# Patient Record
Sex: Female | Born: 1935 | Race: White | Hispanic: No | Marital: Married
Health system: Southern US, Community
[De-identification: ages and names within clinical notes are randomized; demographics above are authoritative.]

---

## 2004-08-26 ENCOUNTER — Ambulatory Visit: Payer: Self-pay | Admitting: Internal Medicine

## 2005-01-13 ENCOUNTER — Ambulatory Visit: Payer: Self-pay | Admitting: Internal Medicine

## 2006-02-16 ENCOUNTER — Ambulatory Visit: Payer: Self-pay | Admitting: Internal Medicine

## 2007-03-15 ENCOUNTER — Ambulatory Visit: Payer: Self-pay | Admitting: Internal Medicine

## 2008-03-25 ENCOUNTER — Ambulatory Visit: Payer: Self-pay | Admitting: Internal Medicine

## 2012-08-15 ENCOUNTER — Ambulatory Visit: Payer: Self-pay | Admitting: Ophthalmology

## 2012-10-03 ENCOUNTER — Ambulatory Visit: Payer: Self-pay | Admitting: Ophthalmology

## 2014-02-08 ENCOUNTER — Ambulatory Visit: Payer: Self-pay | Admitting: Internal Medicine

## 2014-03-27 ENCOUNTER — Ambulatory Visit: Payer: Self-pay | Admitting: Cardiology

## 2014-03-27 LAB — CBC WITH DIFFERENTIAL/PLATELET
BASOS PCT: 0.2 %
Basophil #: 0 10*3/uL (ref 0.0–0.1)
Eosinophil #: 0.1 10*3/uL (ref 0.0–0.7)
Eosinophil %: 1 %
HCT: 47.5 % — ABNORMAL HIGH (ref 35.0–47.0)
HGB: 15.4 g/dL (ref 12.0–16.0)
LYMPHS PCT: 16.5 %
Lymphocyte #: 1.2 10*3/uL (ref 1.0–3.6)
MCH: 28.4 pg (ref 26.0–34.0)
MCHC: 32.3 g/dL (ref 32.0–36.0)
MCV: 88 fL (ref 80–100)
MONO ABS: 0.7 x10 3/mm (ref 0.2–0.9)
MONOS PCT: 10.1 %
NEUTROS PCT: 72.2 %
Neutrophil #: 5.3 10*3/uL (ref 1.4–6.5)
PLATELETS: 169 10*3/uL (ref 150–440)
RBC: 5.41 10*6/uL — ABNORMAL HIGH (ref 3.80–5.20)
RDW: 14.4 % (ref 11.5–14.5)
WBC: 7.4 10*3/uL (ref 3.6–11.0)

## 2014-03-27 LAB — BASIC METABOLIC PANEL
Anion Gap: 4 — ABNORMAL LOW (ref 7–16)
BUN: 19 mg/dL — ABNORMAL HIGH (ref 7–18)
CALCIUM: 9.4 mg/dL (ref 8.5–10.1)
Chloride: 103 mmol/L (ref 98–107)
Co2: 31 mmol/L (ref 21–32)
Creatinine: 0.67 mg/dL (ref 0.60–1.30)
EGFR (African American): >60
GLUCOSE: 92 mg/dL (ref 65–99)
Osmolality: 278 (ref 275–301)
Potassium: 4.2 mmol/L (ref 3.5–5.1)
Sodium: 138 mmol/L (ref 136–145)

## 2014-04-03 ENCOUNTER — Ambulatory Visit: Payer: Self-pay | Admitting: Cardiology

## 2014-04-03 LAB — PROTIME-INR
INR: 1.1
PROTHROMBIN TIME: 14.1 s (ref 11.5–14.7)

## 2015-03-14 NOTE — Op Note (Signed)
PATIENT NAME:  Valerie Espinoza, Valerie Espinoza MR#:  [id number] DATE OF BIRTH:  09-Sep-1925  DATE OF PROCEDURE:  04/03/2014  PRIMARY CARE PHYSICIAN: Dr. Judithann SheenSparks.  PREPROCEDURAL DIAGNOSES:  1.  Sick sinus syndrome. 2.  Atrial fibrillation.   PROCEDURE: Dual chamber pacemaker implantation.  POSTPROCEDURE DIAGNOSIS:  Atrial sensing with ventricular pacing.   INDICATION: The patient is an 81 year old female with known history of coronary artery disease, status post aortic valve replacement. The patient has had a history of atrial fibrillation with variable ventricular response. The patient also has sick sinus syndrome with bradycardia, junctional rhythm with pauses up to 4 seconds with frequent premature ventricular contractions.    DESCRIPTION OF PROCEDURE: The risks, benefits and alternatives of permanent pacemaker implantation were explained to the patient and informed written consent was obtained. She was brought to the operating room in a fasting state. The left pectoral region was prepped and draped in the usual sterile manner. Anesthesia was obtained with 1% Xylocaine locally. A 6 cm incision was performed over the left pectoral region. The pacemaker pocket was generated by electrocautery and blunt dissection. Access was obtained to the left subclavian vein by fine needle aspiration. Ventricular (Medtronic 5092, 52 cm) and atrial (Medtronic 5592, 45 cm) were positioned to the right ventricular apical septum and right atrial appendage under fluoroscopic guidance. After the proper thresholds were obtained, the leads were sutured in place. The pacemaker pocket was irrigated with gentimicin solution. The leads were connected to a dual chamber rate responsive pacemaker generator (Adapta [id number]) and positioned into the pocket. The pocket was closed with 2-0 and 4-0 Vicryl, respectively. Steri-Strips and pressure dressing were applied.    ____________________________ Marcina MillardAlexander Kaaliyah Kita, MD ap:ce D: 04/03/2014  13:39:09 ET T: 04/03/2014 17:54:52 ET JOB#: [id number]  cc: Marcina MillardAlexander Katelin Kutsch, MD, <Dictator> Marcina MillardALEXANDER Calisha Tindel MD ELECTRONICALLY SIGNED 04/21/2014 15:04

## 2015-09-03 IMAGING — CR DG CHEST 1V PORT
1 series · 1 of 1 positions shown · non-contrast
Comparison: 03/27/2014

CLINICAL DATA: Status post pacemaker placement

EXAM:
PORTABLE CHEST - 1 VIEW

[ap]
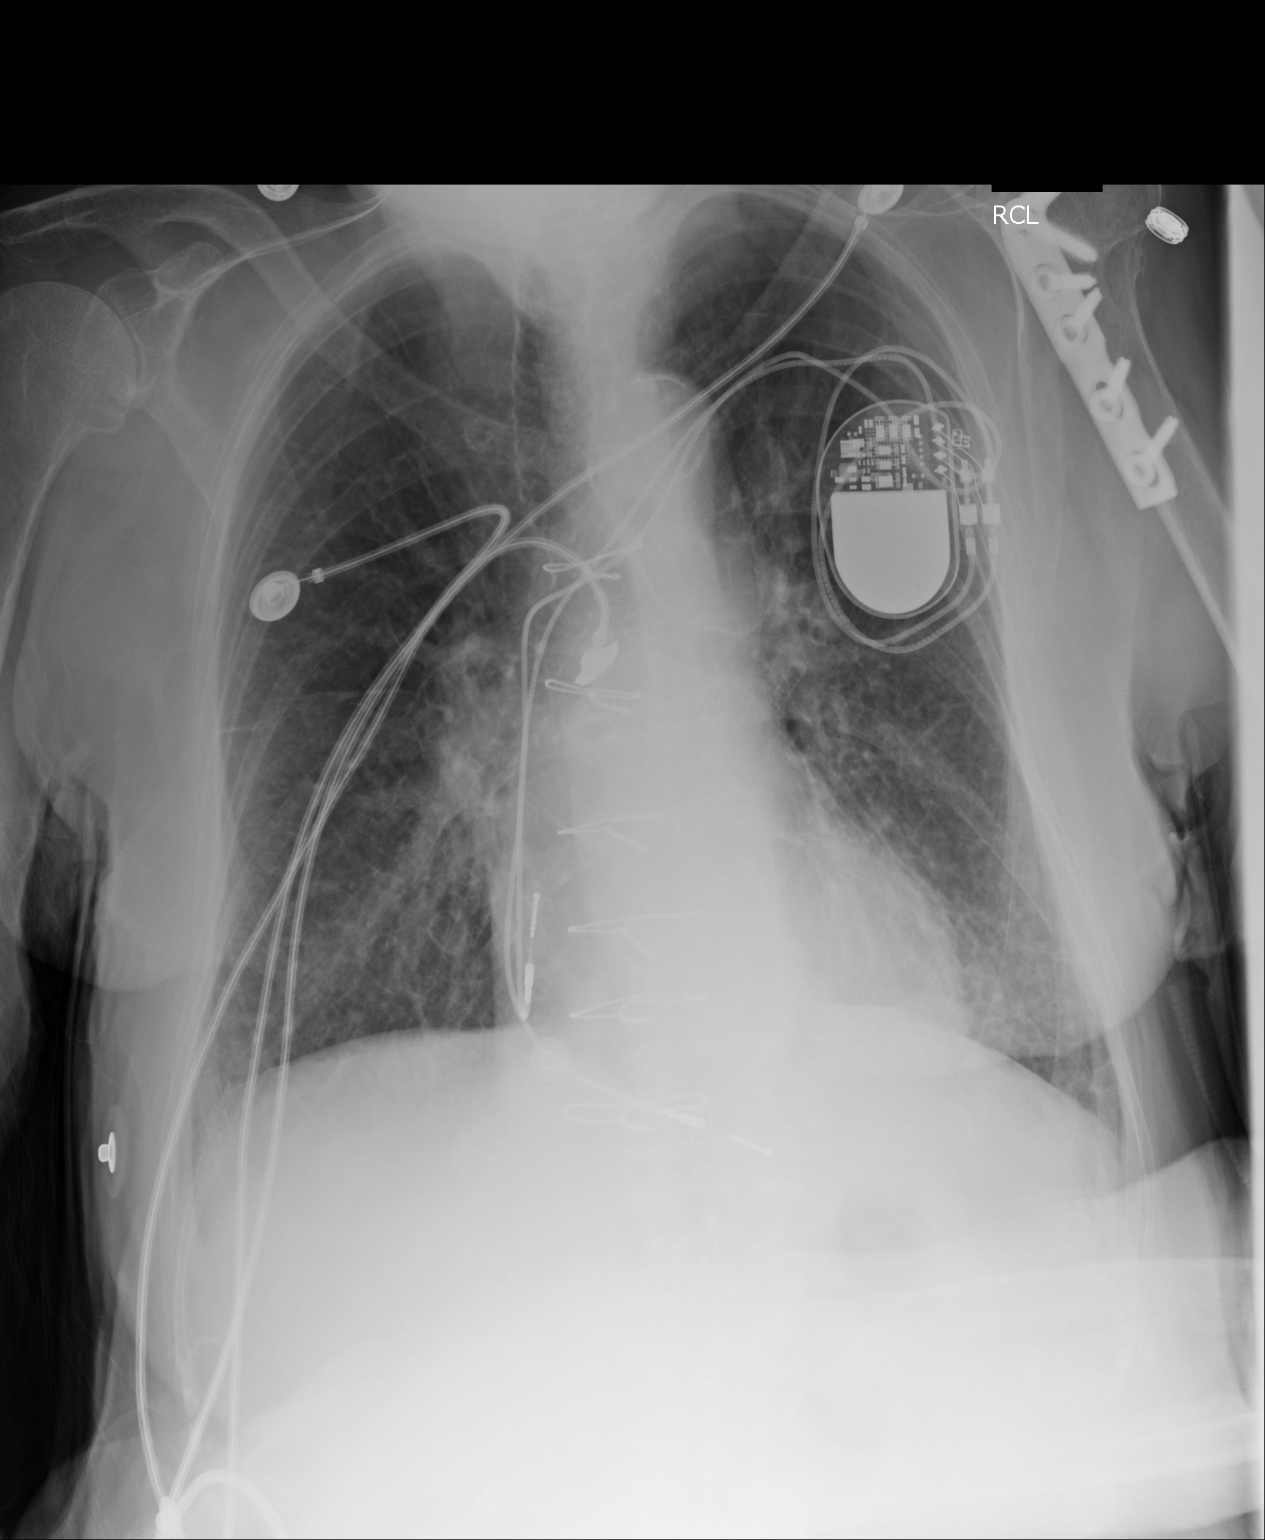

[1 of 1 positions shown; findings below may reference images not displayed]

FINDINGS: New left anterior chest wall sequential pacemaker has its leads
superimposed over the right atrium and right ventricle. There is no
pneumothorax.

There are irregularly thickened interstitial markings that
predominate in the lung bases, left greater than right. This appears
increased from the prior study. Consider mild interstitial edema if
there shortness of Breath.

No other change from the prior study. Stable changes from previous
cardiac surgery.
IMPRESSION: 1. New left anterior chest wall sequential pacemaker. Leads appear
well positioned on this single frontal image superimposed over the
right atrium and right ventricle. No pneumothorax.
2. Interstitial thickening has increased when compared to the prior
study. This may reflect interstitial edema.

## 2017-01-31 ENCOUNTER — Ambulatory Visit
Admission: RE | Admit: 2017-01-31 | Discharge: 2017-01-31 | Disposition: A | Discharge status: Home / Self Care | Payer: Medicare Other | Source: Ambulatory Visit | Attending: Internal Medicine | Admitting: Internal Medicine

## 2017-01-31 DIAGNOSIS — R791 Abnormal coagulation profile: Secondary | ICD-10-CM | POA: Diagnosis present

## 2017-01-31 LAB — PROTIME-INR
INR: 1.19
Prothrombin Time: 15.2 seconds (ref 11.4–15.2)

## 2019-03-22 DEATH — deceased
